# Patient Record
Sex: Male | Born: 2000 | Race: White | Hispanic: No | Marital: Single
Health system: Southern US, Community
[De-identification: ages and names within clinical notes are randomized; demographics above are authoritative.]

---
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
## 2019-09-25 DIAGNOSIS — L0591 Pilonidal cyst without abscess: Secondary | ICD-10-CM | POA: Diagnosis not present

## 2019-11-30 DIAGNOSIS — L0591 Pilonidal cyst without abscess: Secondary | ICD-10-CM | POA: Diagnosis not present

## 2020-02-19 ENCOUNTER — Emergency Department (HOSPITAL_COMMUNITY): Payer: BC Managed Care – PPO

## 2020-02-19 ENCOUNTER — Other Ambulatory Visit: Payer: Self-pay

## 2020-02-19 ENCOUNTER — Encounter (HOSPITAL_COMMUNITY): Payer: Self-pay | Admitting: Emergency Medicine

## 2020-02-19 ENCOUNTER — Emergency Department (HOSPITAL_COMMUNITY)
Admission: EM | Admit: 2020-02-19 | Discharge: 2020-02-19 | Disposition: A | Discharge status: Home / Self Care | Payer: BC Managed Care – PPO | Attending: Emergency Medicine | Admitting: Emergency Medicine

## 2020-02-19 DIAGNOSIS — K529 Noninfective gastroenteritis and colitis, unspecified: Secondary | ICD-10-CM | POA: Diagnosis not present

## 2020-02-19 DIAGNOSIS — K2900 Acute gastritis without bleeding: Secondary | ICD-10-CM | POA: Diagnosis not present

## 2020-02-19 DIAGNOSIS — R109 Unspecified abdominal pain: Secondary | ICD-10-CM | POA: Diagnosis not present

## 2020-02-19 DIAGNOSIS — K9289 Other specified diseases of the digestive system: Secondary | ICD-10-CM | POA: Diagnosis not present

## 2020-02-19 DIAGNOSIS — K2901 Acute gastritis with bleeding: Secondary | ICD-10-CM | POA: Diagnosis not present

## 2020-02-19 LAB — URINALYSIS, ROUTINE W REFLEX MICROSCOPIC
Bilirubin Urine: NEGATIVE
Glucose, UA: NEGATIVE mg/dL
Hgb urine dipstick: NEGATIVE
Ketones, ur: 80 mg/dL — AB
Leukocytes,Ua: NEGATIVE
Nitrite: NEGATIVE
Protein, ur: 100 mg/dL — AB
Specific Gravity, Urine: 1.035 — ABNORMAL HIGH (ref 1.005–1.030)
pH: 5 (ref 5.0–8.0)

## 2020-02-19 LAB — COMPREHENSIVE METABOLIC PANEL
ALT: 17 U/L (ref 0–44)
AST: 22 U/L (ref 15–41)
Albumin: 4.7 g/dL (ref 3.5–5.0)
Alkaline Phosphatase: 74 U/L (ref 38–126)
Anion gap: 14 (ref 5–15)
BUN: 11 mg/dL (ref 6–20)
CO2: 24 mmol/L (ref 22–32)
Calcium: 9.6 mg/dL (ref 8.9–10.3)
Chloride: 104 mmol/L (ref 98–111)
Creatinine, Ser: 1.11 mg/dL (ref 0.61–1.24)
GFR calc Af Amer: >60 mL/min (ref >60)
GFR calc non Af Amer: >60 mL/min (ref >60)
Glucose, Bld: 102 mg/dL — ABNORMAL HIGH (ref 70–99)
Potassium: 4.5 mmol/L (ref 3.5–5.1)
Sodium: 142 mmol/L (ref 135–145)
Total Bilirubin: 0.7 mg/dL (ref 0.3–1.2)
Total Protein: 7.7 g/dL (ref 6.5–8.1)

## 2020-02-19 LAB — CBC
HCT: 51 % (ref 39.0–52.0)
HCT: 44.9 % (ref 39.0–52.0)
Hemoglobin: 17.4 g/dL — ABNORMAL HIGH (ref 13.0–17.0)
Hemoglobin: 15.4 g/dL (ref 13.0–17.0)
MCH: 30.9 pg (ref 26.0–34.0)
MCH: 31 pg (ref 26.0–34.0)
MCHC: 34.1 g/dL (ref 30.0–36.0)
MCHC: 34.3 g/dL (ref 30.0–36.0)
MCV: 90.6 fL (ref 80.0–100.0)
MCV: 90.5 fL (ref 80.0–100.0)
Platelets: 224 10*3/uL (ref 150–400)
Platelets: 198 10*3/uL (ref 150–400)
RBC: 5.63 MIL/uL (ref 4.22–5.81)
RBC: 4.96 MIL/uL (ref 4.22–5.81)
RDW: 12.5 % (ref 11.5–15.5)
RDW: 12.6 % (ref 11.5–15.5)
WBC: 5.6 10*3/uL (ref 4.0–10.5)
WBC: 4.2 10*3/uL (ref 4.0–10.5)
nRBC: 0 % (ref 0.0–0.2)
nRBC: 0 % (ref 0.0–0.2)

## 2020-02-19 LAB — LIPASE, BLOOD: Lipase: 27 U/L (ref 11–51)

## 2020-02-19 MED ORDER — SODIUM CHLORIDE 0.9 % IV BOLUS
1000.0000 mL | Freq: Once | INTRAVENOUS | Status: AC
Start: 2020-02-19 — End: 2020-02-19
  Administered 2020-02-19: 20:00:00 1000 mL via INTRAVENOUS

## 2020-02-19 MED ORDER — SODIUM CHLORIDE 0.9 % IV BOLUS
1000.0000 mL | Freq: Once | INTRAVENOUS | Status: AC
  Administered 2020-02-19: 1000 mL via INTRAVENOUS

## 2020-02-19 MED ORDER — ONDANSETRON HCL 4 MG/2ML IJ SOLN
4.0000 mg | Freq: Once | INTRAMUSCULAR | Status: AC
  Administered 2020-02-19: 4 mg via INTRAVENOUS
  Filled 2020-02-19: qty 2

## 2020-02-19 MED ORDER — IOHEXOL 300 MG/ML  SOLN
100.0000 mL | Freq: Once | INTRAMUSCULAR | Status: AC | PRN
  Administered 2020-02-19: 100 mL via INTRAVENOUS

## 2020-02-19 MED ORDER — PANTOPRAZOLE SODIUM 40 MG IV SOLR
40.0000 mg | Freq: Once | INTRAVENOUS | Status: AC
  Administered 2020-02-19: 19:00:00 40 mg via INTRAVENOUS
  Filled 2020-02-19: qty 40

## 2020-02-19 MED ORDER — SODIUM CHLORIDE 0.9 % IV BOLUS: 1000.0000 mL | Freq: Once | INTRAVENOUS | Status: DC

## 2020-02-19 MED ORDER — PROMETHAZINE HCL 25 MG PO TABS: 25.0000 mg | ORAL_TABLET | Freq: Four times a day (QID) | ORAL | 0 refills | Status: AC | PRN

## 2020-02-19 MED ORDER — PANTOPRAZOLE SODIUM 40 MG PO TBEC: 40.0000 mg | DELAYED_RELEASE_TABLET | Freq: Every day | ORAL | 0 refills | Status: AC

## 2020-02-19 NOTE — Discharge Instructions (Addendum)
Follow-up with the GI doctor provided.  Return here as needed.  Slowly increase your fluid intake.

## 2020-02-19 NOTE — ED Provider Notes (Signed)
[facility] Provider Note   CSN: [id number] Arrival date & time: 02/19/20  1338     History Chief Complaint  Patient presents with  . Abdominal Pain    Matthew Frost is a 19 y.o. male.  HPI Patient presents to the emergency department with nausea vomiting and diarrhea.  The patient states that he feels like he has had blood in his vomit over the last 2 days.  The patient states that he is unable to quantify how much this is.  The patient states that he felt like his stools were darker as well.  Patient states that nothing seems make condition better or worse.  The patient states he did not take any medications prior to arrival for symptoms.  Patient denies any medical problems.  The patient denies chest pain, shortness of breath, headache,blurred vision, neck pain, fever, cough, weakness, numbness, dizziness, anorexia, edema,rash, back pain, dysuria, hematemesis, bloody stool, near syncope, or syncope.    History reviewed. No pertinent past medical history.  There are no problems to display for this patient.   History reviewed. No pertinent surgical history.     No family history on file.  Social History   Tobacco Use  . Smoking status: Not on file  Substance Use Topics  . Alcohol use: Not on file  . Drug use: Not on file    Home Medications Prior to Admission medications   Medication Sig Start Date End Date Taking? Authorizing Provider  ibuprofen (ADVIL) 200 MG tablet Take 400 mg by mouth every 6 (six) hours as needed for moderate pain.   Yes [provider]  Phenyleph-Doxylamine-DM-APAP (NYQUIL SEVERE+ VAPOCOOL) 5-6.25-10-325 MG/15ML LIQD Take 15 mLs by mouth every 4 (four) hours as needed (congestion).   Yes [provider]    Allergies    Patient has no known allergies.  Review of Systems   Review of Systems All other systems negative except as documented in the HPI. All pertinent positives and  negatives as reviewed in the HPI.  Physical Exam Updated Vital Signs BP (!) 143/88 (BP Location: Right Arm)   Pulse 68   Temp 98.3 F (36.8 C) (Oral)   Resp 20   Ht 6\' 1"  (1.854 m)   Wt 104.3 kg   SpO2 95%   BMI 30.34 kg/m   Physical Exam Vitals and nursing note reviewed.  Constitutional:      General: He is not in acute distress.    Appearance: He is well-developed.  HENT:     Head: Normocephalic and atraumatic.  Eyes:     Pupils: Pupils are equal, round, and reactive to light.  Cardiovascular:     Rate and Rhythm: Normal rate and regular rhythm.     Heart sounds: Normal heart sounds. No murmur. No friction rub. No gallop.   Pulmonary:     Effort: Pulmonary effort is normal. No respiratory distress.     Breath sounds: Normal breath sounds. No wheezing.  Abdominal:     General: Bowel sounds are normal. There is no distension.     Palpations: Abdomen is soft.     Tenderness: There is abdominal tenderness in the epigastric area.     Hernia: No hernia is present.  Musculoskeletal:     Cervical back: Normal range of motion and neck supple.  Skin:    General: Skin is warm and dry.     Capillary Refill: Capillary refill takes less than 2 seconds.  Findings: No erythema or rash.  Neurological:     Mental Status: He is alert and oriented to person, place, and time.     Motor: No abnormal muscle tone.     Coordination: Coordination normal.  Psychiatric:        Behavior: Behavior normal.     ED Results / Procedures / Treatments   Labs (all labs ordered are listed, but only abnormal results are displayed) Labs Reviewed  COMPREHENSIVE METABOLIC PANEL - Abnormal; Notable for the following components:      Result Value   Glucose, Bld 102 (*)    All other components within normal limits  CBC - Abnormal; Notable for the following components:   Hemoglobin 17.4 (*)    All other components within normal limits  URINALYSIS, ROUTINE W REFLEX MICROSCOPIC - Abnormal; Notable  for the following components:   Color, Urine AMBER (*)    Specific Gravity, Urine 1.035 (*)    Ketones, ur 80 (*)    Protein, ur 100 (*)    Bacteria, UA RARE (*)    All other components within normal limits  LIPASE, BLOOD  CBC    EKG None  Radiology CT Abdomen Pelvis W Contrast  Result Date: 02/19/2020 CLINICAL DATA:  Abdominal pain, hematemesis.  Two days duration. EXAM: CT ABDOMEN AND PELVIS WITH CONTRAST TECHNIQUE: Multidetector CT imaging of the abdomen and pelvis was performed using the standard protocol following bolus administration of intravenous contrast. CONTRAST:  OMNIPAQUE IOHEXOL 300 MG/ML  SOLN COMPARISON:  None. FINDINGS: Lower chest: Normal Hepatobiliary: Normal Pancreas: Normal Spleen: Normal Adrenals/Urinary Tract: Adrenal glands are normal. Kidneys are normal. Bladder is normal. Stomach/Bowel: No sign of ileus or obstruction. No sign of bowel inflammation. Appendix is normal. Vascular/Lymphatic: Normal Reproductive: Normal Other: No free fluid or air. Musculoskeletal: Normal IMPRESSION: Normal CT scan of the abdomen and pelvis. No abnormality seen related to the clinical presentation. No evidence of bowel pathology by CT. Electronically Signed   By: Paulina Fusi M.D.   On: 02/19/2020 19:44    Procedures Procedures (including critical care time)  Medications Ordered in ED Medications  sodium chloride 0.9 % bolus 1,000 mL (has no administration in time range)  sodium chloride 0.9 % bolus 1,000 mL (0 mLs Intravenous Stopped 02/19/20 1958)  ondansetron (ZOFRAN) injection 4 mg (4 mg Intravenous Given 02/19/20 1854)  pantoprazole (PROTONIX) injection 40 mg (40 mg Intravenous Given 02/19/20 1854)  sodium chloride 0.9 % bolus 1,000 mL (1,000 mLs Intravenous New Bag/Given 02/19/20 2015)  iohexol (OMNIPAQUE) 300 MG/ML solution 100 mL (100 mLs Intravenous Contrast Given 02/19/20 1919)    ED Course  I have reviewed the triage vital signs and the nursing notes.  Pertinent  labs & imaging results that were available during my care of the patient were reviewed by me and considered in my medical decision making (see chart for details).    MDM Rules/Calculators/A&P                      The patient most likely has gastroenteritis based on his history and physical exam findings.  The patient's laboratory test does not show any significant signs of blood loss at this time.  The patient does have signs of dehydration noted on his laboratory testing.  The patient had a CT scan which did not show any abnormalities at this time.  I have advised the patient that he will need to follow-up with GI.  Told to return here as  needed.  I advised the mother and the patient to return for any worsening in his condition.  Patient is feeling improvement following the IV fluids and medications that were given here in the emergency department. Final Clinical Impression(s) / ED Diagnoses Final diagnoses:  None    Rx / DC Orders ED Discharge Orders    None       Kyra Manges 02/19/20 2111    Terrilee Files, MD 02/20/20 1038

## 2020-02-19 NOTE — ED Triage Notes (Signed)
Pt endorses upper abd pain and that he is throwing up blood for 2 days.

## 2020-08-05 DIAGNOSIS — M9901 Segmental and somatic dysfunction of cervical region: Secondary | ICD-10-CM | POA: Diagnosis not present

## 2020-08-05 DIAGNOSIS — M9902 Segmental and somatic dysfunction of thoracic region: Secondary | ICD-10-CM | POA: Diagnosis not present

## 2020-08-05 DIAGNOSIS — M9905 Segmental and somatic dysfunction of pelvic region: Secondary | ICD-10-CM | POA: Diagnosis not present

## 2020-08-05 DIAGNOSIS — M9903 Segmental and somatic dysfunction of lumbar region: Secondary | ICD-10-CM | POA: Diagnosis not present

## 2020-11-25 DIAGNOSIS — R110 Nausea: Secondary | ICD-10-CM | POA: Diagnosis not present

## 2020-11-25 DIAGNOSIS — K6289 Other specified diseases of anus and rectum: Secondary | ICD-10-CM | POA: Diagnosis not present

## 2020-11-25 DIAGNOSIS — K625 Hemorrhage of anus and rectum: Secondary | ICD-10-CM | POA: Diagnosis not present

## 2020-11-25 DIAGNOSIS — Z6828 Body mass index (BMI) 28.0-28.9, adult: Secondary | ICD-10-CM | POA: Diagnosis not present

## 2020-11-26 DIAGNOSIS — K6289 Other specified diseases of anus and rectum: Secondary | ICD-10-CM | POA: Diagnosis not present

## 2020-11-26 DIAGNOSIS — R110 Nausea: Secondary | ICD-10-CM | POA: Diagnosis not present

## 2020-11-26 DIAGNOSIS — K6389 Other specified diseases of intestine: Secondary | ICD-10-CM | POA: Diagnosis not present

## 2020-11-26 DIAGNOSIS — K625 Hemorrhage of anus and rectum: Secondary | ICD-10-CM | POA: Diagnosis not present

## 2021-03-12 DIAGNOSIS — F4323 Adjustment disorder with mixed anxiety and depressed mood: Secondary | ICD-10-CM | POA: Diagnosis not present

## 2021-03-19 DIAGNOSIS — F4323 Adjustment disorder with mixed anxiety and depressed mood: Secondary | ICD-10-CM | POA: Diagnosis not present

## 2021-03-26 DIAGNOSIS — F4323 Adjustment disorder with mixed anxiety and depressed mood: Secondary | ICD-10-CM | POA: Diagnosis not present

## 2021-04-03 DIAGNOSIS — F4323 Adjustment disorder with mixed anxiety and depressed mood: Secondary | ICD-10-CM | POA: Diagnosis not present

## 2021-04-10 DIAGNOSIS — F4323 Adjustment disorder with mixed anxiety and depressed mood: Secondary | ICD-10-CM | POA: Diagnosis not present

## 2021-04-24 DIAGNOSIS — F4323 Adjustment disorder with mixed anxiety and depressed mood: Secondary | ICD-10-CM | POA: Diagnosis not present

## 2021-05-07 DIAGNOSIS — Z13220 Encounter for screening for lipoid disorders: Secondary | ICD-10-CM | POA: Diagnosis not present

## 2021-05-07 DIAGNOSIS — F17200 Nicotine dependence, unspecified, uncomplicated: Secondary | ICD-10-CM | POA: Diagnosis not present

## 2021-05-07 DIAGNOSIS — K644 Residual hemorrhoidal skin tags: Secondary | ICD-10-CM | POA: Diagnosis not present

## 2021-05-07 DIAGNOSIS — Z872 Personal history of diseases of the skin and subcutaneous tissue: Secondary | ICD-10-CM | POA: Diagnosis not present

## 2021-05-07 DIAGNOSIS — Z0000 Encounter for general adult medical examination without abnormal findings: Secondary | ICD-10-CM | POA: Diagnosis not present

## 2021-06-17 DIAGNOSIS — F332 Major depressive disorder, recurrent severe without psychotic features: Secondary | ICD-10-CM | POA: Diagnosis not present

## 2021-11-03 DIAGNOSIS — Z23 Encounter for immunization: Secondary | ICD-10-CM | POA: Diagnosis not present

## 2021-11-13 DIAGNOSIS — N489 Disorder of penis, unspecified: Secondary | ICD-10-CM | POA: Diagnosis not present

## 2022-08-03 DIAGNOSIS — L03012 Cellulitis of left finger: Secondary | ICD-10-CM | POA: Diagnosis not present

## 2022-08-03 DIAGNOSIS — Z23 Encounter for immunization: Secondary | ICD-10-CM | POA: Diagnosis not present

## 2022-08-03 DIAGNOSIS — W540XXA Bitten by dog, initial encounter: Secondary | ICD-10-CM | POA: Diagnosis not present

## 2022-12-23 DIAGNOSIS — J029 Acute pharyngitis, unspecified: Secondary | ICD-10-CM | POA: Diagnosis not present

## 2022-12-23 DIAGNOSIS — H6692 Otitis media, unspecified, left ear: Secondary | ICD-10-CM | POA: Diagnosis not present

## 2022-12-23 DIAGNOSIS — R0981 Nasal congestion: Secondary | ICD-10-CM | POA: Diagnosis not present

## 2022-12-23 DIAGNOSIS — Z03818 Encounter for observation for suspected exposure to other biological agents ruled out: Secondary | ICD-10-CM | POA: Diagnosis not present

## 2023-07-08 DIAGNOSIS — L218 Other seborrheic dermatitis: Secondary | ICD-10-CM | POA: Diagnosis not present

## 2024-10-04 ENCOUNTER — Other Ambulatory Visit (HOSPITAL_COMMUNITY): Payer: Self-pay | Admitting: Physician Assistant

## 2024-10-04 DIAGNOSIS — R350 Frequency of micturition: Secondary | ICD-10-CM

## 2024-10-04 DIAGNOSIS — R300 Dysuria: Secondary | ICD-10-CM

## 2024-10-04 DIAGNOSIS — R1030 Lower abdominal pain, unspecified: Secondary | ICD-10-CM

## 2024-10-05 ENCOUNTER — Ambulatory Visit (HOSPITAL_COMMUNITY)

## 2024-10-05 ENCOUNTER — Encounter (HOSPITAL_COMMUNITY): Payer: Self-pay
